# Patient Record
Sex: Male | Born: 1995 | Race: Black or African American | Hispanic: No | Marital: Single | Smoking: Never smoker
Health system: Southern US, Community
[De-identification: ages and names within clinical notes are randomized; demographics above are authoritative.]

---

## 2018-03-19 ENCOUNTER — Emergency Department (HOSPITAL_COMMUNITY)
Admission: EM | Admit: 2018-03-19 | Discharge: 2018-03-19 | Disposition: A | Discharge status: Home / Self Care | Payer: Commercial Managed Care - PPO | Attending: Emergency Medicine | Admitting: Emergency Medicine

## 2018-03-19 ENCOUNTER — Emergency Department (HOSPITAL_COMMUNITY): Payer: Commercial Managed Care - PPO

## 2018-03-19 ENCOUNTER — Other Ambulatory Visit: Payer: Self-pay

## 2018-03-19 ENCOUNTER — Other Ambulatory Visit (HOSPITAL_COMMUNITY): Payer: Commercial Managed Care - PPO

## 2018-03-19 ENCOUNTER — Encounter (HOSPITAL_COMMUNITY): Payer: Self-pay | Admitting: *Deleted

## 2018-03-19 DIAGNOSIS — Y9389 Activity, other specified: Secondary | ICD-10-CM | POA: Insufficient documentation

## 2018-03-19 DIAGNOSIS — S8992XA Unspecified injury of left lower leg, initial encounter: Secondary | ICD-10-CM | POA: Diagnosis present

## 2018-03-19 DIAGNOSIS — Y92410 Unspecified street and highway as the place of occurrence of the external cause: Secondary | ICD-10-CM | POA: Insufficient documentation

## 2018-03-19 DIAGNOSIS — Y999 Unspecified external cause status: Secondary | ICD-10-CM | POA: Diagnosis not present

## 2018-03-19 MED ORDER — CYCLOBENZAPRINE HCL 10 MG PO TABS: 10.0000 mg | ORAL_TABLET | Freq: Two times a day (BID) | ORAL | 0 refills | Status: DC | PRN

## 2018-03-19 MED ORDER — IBUPROFEN 200 MG PO TABS
600.0000 mg | ORAL_TABLET | Freq: Once | ORAL | Status: AC
  Administered 2018-03-19: 600 mg via ORAL
  Filled 2018-03-19: qty 3
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
## 2018-03-19 NOTE — ED Notes (Signed)
Pt did esignature, but it did not take on penpad. Pt verbalized understanding of d/c paperwork

## 2018-03-19 NOTE — Discharge Instructions (Addendum)
Please read instructions below. Apply ice to your areas of pain for 20 minutes at a time. You can take 600 mg of Advil/ibuprofen every 6 hours as needed for pain. You can take flexeril every 12 hours as needed for muscle spasm. Be aware this medication can make you drowsy. Schedule an appointment with the orthopedic specialist in 1 week for follow-up on your injury if symptoms persist. Return to the ER for severely worsening headache, vision changes, if new numbness or tingling in your arms or legs, inability to urinate, inability to hold your bowels, or weakness in your extremities.

## 2019-10-09 ENCOUNTER — Ambulatory Visit (HOSPITAL_COMMUNITY)
Admission: EM | Admit: 2019-10-09 | Discharge: 2019-10-09 | Disposition: A | Discharge status: Home / Self Care | Payer: Commercial Managed Care - PPO

## 2019-10-09 ENCOUNTER — Other Ambulatory Visit: Payer: Self-pay

## 2019-10-09 ENCOUNTER — Encounter (HOSPITAL_COMMUNITY): Payer: Self-pay

## 2019-10-09 DIAGNOSIS — R197 Diarrhea, unspecified: Secondary | ICD-10-CM | POA: Diagnosis not present

## 2019-10-09 NOTE — ED Provider Notes (Signed)
[facility]    CSN: [id number] Arrival date & time: 10/09/19  0831      History   Chief Complaint Chief Complaint  Patient presents with  . Diarrhea  . Abdominal Pain    HPI Brian Bailey is a 24 y.o. male.   Patient is an otherwise healthy 24 year old male who presents today with diarrhea x1 week.  Symptoms have been intermittent, waxing waning.  He gets generalized abdominal cramping and then episode of watery diarrhea.  The abdominal pain resolves after the diarrhea.  Denies any blood in stool.  Reporting 2 days ago the stool returned to normal and then he ate a high fatty meal last night in the diarrhea returned.  No current abdominal pain no fever, recent traveling, recent antibiotic use.  No alcohol or drug use.  No recent sick contacts, viral type symptoms.  ROS per HPI      History reviewed. No pertinent past medical history.  There are no problems to display for this patient.   History reviewed. No pertinent surgical history.     Home Medications    Prior to Admission medications   Not on File    Family History Family History  Family history unknown: Yes    Social History Social History   Tobacco Use  . Smoking status: Never Smoker  Substance Use Topics  . Alcohol use: Not on file  . Drug use: Not on file     Allergies   Patient has no known allergies.   Review of Systems Review of Systems   Physical Exam Triage Vital Signs ED Triage Vitals  Enc Vitals Group     BP 10/09/19 0914 122/72     Pulse Rate 10/09/19 0914 61     Resp 10/09/19 0914 17     Temp 10/09/19 0914 98.4 F (36.9 C)     Temp Source 10/09/19 0914 Oral     SpO2 10/09/19 0914 100 %     Weight --      Height --      Head Circumference --      Peak Flow --      Pain Score 10/09/19 0916 5     Pain Loc --      Pain Edu? --      Excl. in GC? --    No data found.  Updated Vital Signs BP 122/72 (BP Location: Right Arm)   Pulse 61   Temp 98.4 F  (36.9 C) (Oral)   Resp 17   SpO2 100%   Visual Acuity Right Eye Distance:   Left Eye Distance:   Bilateral Distance:    Right Eye Near:   Left Eye Near:    Bilateral Near:     Physical Exam Vitals and nursing note reviewed.  Constitutional:      Appearance: Normal appearance.  HENT:     Head: Normocephalic and atraumatic.     Nose: Nose normal.  Eyes:     Conjunctiva/sclera: Conjunctivae normal.  Pulmonary:     Effort: Pulmonary effort is normal.  Musculoskeletal:        General: Normal range of motion.     Cervical back: Normal range of motion.  Skin:    General: Skin is warm and dry.  Neurological:     Mental Status: He is alert.  Psychiatric:        Mood and Affect: Mood normal.      UC Treatments / Results  Labs (all labs ordered are  listed, but only abnormal results are displayed) Labs Reviewed - No data to display  EKG   Radiology No results found.  Procedures Procedures (including critical care time)  Medications Ordered in UC Medications - No data to display  Initial Impression / Assessment and Plan / UC Course  I have reviewed the triage vital signs and the nursing notes.  Pertinent labs & imaging results that were available during my care of the patient were reviewed by me and considered in my medical decision making (see chart for details).     [place] likely infectious or diet related. Recommend start probiotic Also recommended eating bland and clean for the next week to see if this improves symptoms. Gatorade for hydration and electrolyte replacement Recommended if this continues over the next week he will need to come back in for stool sample Follow up as needed for continued or worsening symptoms  Final Clinical Impressions(s) / UC Diagnoses   Final diagnoses:  Diarrhea of presumed infectious origin     Discharge Instructions     Believe this may be due to bacteria in the gut.  Try starting a probiotic. You can get  this over the counter.  Eat clean and no foods that upset the stomach.  Gatorades for rehydration.  Follow up as needed for continued or worsening symptoms Information about diet and diarrhea put in instructions.      ED Prescriptions    None     PDMP not reviewed this encounter.   Loura Halt A, NP 10/09/19 1020

## 2019-10-09 NOTE — Discharge Instructions (Signed)
Believe this may be due to bacteria in the gut.  Try starting a probiotic. You can get this over the counter.  Eat clean and no foods that upset the stomach.  Gatorades for rehydration.  Follow up as needed for continued or worsening symptoms Information about diet and diarrhea put in instructions.

## 2019-10-09 NOTE — ED Triage Notes (Signed)
Pt presents with generalized stomach cramping & diarrhea for a little over a week.

## 2020-03-25 ENCOUNTER — Other Ambulatory Visit: Payer: Self-pay

## 2020-03-25 ENCOUNTER — Ambulatory Visit (HOSPITAL_COMMUNITY)
Admission: EM | Admit: 2020-03-25 | Discharge: 2020-03-25 | Disposition: A | Discharge status: Home / Self Care | Payer: Commercial Managed Care - PPO | Attending: Family Medicine | Admitting: Family Medicine

## 2020-03-25 ENCOUNTER — Encounter (HOSPITAL_COMMUNITY): Payer: Self-pay

## 2020-03-25 DIAGNOSIS — Z20822 Contact with and (suspected) exposure to covid-19: Secondary | ICD-10-CM | POA: Diagnosis not present

## 2020-03-25 DIAGNOSIS — J029 Acute pharyngitis, unspecified: Secondary | ICD-10-CM

## 2020-03-25 MED ORDER — AMOXICILLIN 875 MG PO TABS: 875.0000 mg | ORAL_TABLET | Freq: Two times a day (BID) | ORAL | 0 refills | Status: AC

## 2020-03-25 NOTE — Discharge Instructions (Addendum)
You have been tested for COVID-19 today. °If your test returns positive, you will receive a phone call from [facility] regarding your results. °Negative test results are not called. °Both positive and negative results area always visible on MyChart. °If you do not have a MyChart account, sign up instructions are provided in your discharge papers. °Please do not hesitate to contact us should you have questions or concerns. ° °

## 2020-03-25 NOTE — ED Triage Notes (Signed)
Pt presents with sore throat X 3 days.  

## 2020-03-26 LAB — SARS CORONAVIRUS 2 (TAT 6-24 HRS): SARS Coronavirus 2: NEGATIVE

## 2020-03-26 NOTE — ED Provider Notes (Signed)
[facility]   [id number] 03/25/20 Arrival Time: 1853  ASSESSMENT & PLAN:  1. Pharyngitis, unspecified etiology     Will tx empirically for bacterial infection. COVID-19 testing sent. OTC symptom care as needed.  Begin: Meds ordered this encounter  Medications  . amoxicillin (AMOXIL) 875 MG tablet    Sig: Take 1 tablet (875 mg total) by mouth 2 (two) times daily for 10 days.    Dispense:  20 tablet    Refill:  0     Follow-up Information    [facility].   Specialty: Urgent Care Why: If worsening or failing to improve as anticipated. Contact information: [address] [place] [address] [phone]              Reviewed expectations re: course of current medical issues. Questions answered. Outlined signs and symptoms indicating need for more acute intervention. Understanding verbalized. After Visit Summary given.   SUBJECTIVE: History from: patient. Brian Bailey is a 24 y.o. male who presents with worries regarding COVID-19. Known COVID-19 contact: none. Recent travel: none. Reports: sore throat for 2-3 days; worsening. Denies: fever and cough. Normal PO intake without n/v/d.    OBJECTIVE:  Vitals:   03/25/20 2045 03/25/20 2047  BP:  121/60  Pulse: 65   Resp: 17   Temp: 99.2 F (37.3 C)   TempSrc: Oral   SpO2: 97%     General appearance: alert; no distress Eyes: PERRLA; EOMI; conjunctiva normal HENT: [place]; AT; without nasal congestion; significant throat erythema with exudative tonsils Neck: supple; small cervical LAD Lungs: speaks full sentences without difficulty; unlabored Extremities: no edema Skin: warm and dry Neurologic: normal gait Psychological: alert and cooperative; normal mood and affect  Labs:  Labs Reviewed  SARS CORONAVIRUS 2 (TAT 6-24 HRS)   No Known Allergies  History reviewed. No pertinent past medical history. Social History   Socioeconomic History  . Marital  status: Single    Spouse name: Not on file  . Number of children: Not on file  . Years of education: Not on file  . Highest education level: Not on file  Occupational History  . Not on file  Tobacco Use  . Smoking status: Never Smoker  Substance and Sexual Activity  . Alcohol use: Not on file  . Drug use: Not on file  . Sexual activity: Not on file  Other Topics Concern  . Not on file  Social History Narrative  . Not on file   Social Determinants of Health   Financial Resource Strain:   . Difficulty of Paying Living Expenses: Not on file  Food Insecurity:   . Worried About Programme researcher, broadcasting/film/video in the Last Year: Not on file  . Ran Out of Food in the Last Year: Not on file  Transportation Needs:   . Lack of Transportation (Medical): Not on file  . Lack of Transportation (Non-Medical): Not on file  Physical Activity:   . Days of Exercise per Week: Not on file  . Minutes of Exercise per Session: Not on file  Stress:   . Feeling of Stress : Not on file  Social Connections:   . Frequency of Communication with Friends and Family: Not on file  . Frequency of Social Gatherings with Friends and Family: Not on file  . Attends Religious Services: Not on file  . Active Member of Clubs or Organizations: Not on file  . Attends Banker Meetings: Not on file  .  Marital Status: Not on file  Intimate Partner Violence:   . Fear of Current or Ex-Partner: Not on file  . Emotionally Abused: Not on file  . Physically Abused: Not on file  . Sexually Abused: Not on file   Family History  Family history unknown: Yes   History reviewed. No pertinent surgical history.   Mardella Layman, MD 03/26/20 [phone]
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
## 2020-10-13 ENCOUNTER — Ambulatory Visit (HOSPITAL_COMMUNITY)
Admission: EM | Admit: 2020-10-13 | Discharge: 2020-10-13 | Disposition: A | Discharge status: Home / Self Care | Payer: Commercial Managed Care - PPO | Attending: Emergency Medicine | Admitting: Emergency Medicine

## 2020-10-13 ENCOUNTER — Other Ambulatory Visit: Payer: Self-pay

## 2020-10-13 ENCOUNTER — Encounter (HOSPITAL_COMMUNITY): Payer: Self-pay

## 2020-10-13 DIAGNOSIS — J069 Acute upper respiratory infection, unspecified: Secondary | ICD-10-CM

## 2020-10-13 MED ORDER — AZITHROMYCIN 250 MG PO TABS: ORAL_TABLET | ORAL | 0 refills | Status: AC

## 2020-10-13 MED ORDER — BENZONATATE 100 MG PO CAPS: 100.0000 mg | ORAL_CAPSULE | Freq: Three times a day (TID) | ORAL | 0 refills | Status: AC | PRN

## 2020-10-13 NOTE — ED Triage Notes (Signed)
Pt presents with c/o productive cough for over a week. Denies fever or other symptoms

## 2020-10-13 NOTE — Discharge Instructions (Signed)
Push fluids to ensure adequate hydration and keep secretions thin.  Over the counter medications as needed for your symptoms, I tend to recommend mucinex d for cough and congestion.  Complete course of antibiotics.  Tessalon as needed for cough.  If symptoms worsen or do not improve in the next week to return to be seen or to follow up with your PCP.

## 2020-10-13 NOTE — ED Provider Notes (Signed)
[facility]    CSN: [id number] Arrival date & time: 10/13/20  1644      History   Chief Complaint Chief Complaint  Patient presents with  . Cough    HPI Brian Bailey is a 25 y.o. male.   Brian Bailey presents with complaints of cough and congestion. Originally started around a week ago. Had started to feel better but then worsened again. Cough is productive of clear mucus. No fevers. No shortness of breath . Some ear popping. No sore throat. Some headache with cough only. Has been around others with similar cough/ uri symptoms. Negative covid pcr testing two days ago. No gi symptoms. No history of asthma. Doesn't smoke.    ROS per HPI, negative if not otherwise mentioned.      History reviewed. No pertinent past medical history.  There are no problems to display for this patient.   History reviewed. No pertinent surgical history.     Home Medications    Prior to Admission medications   Medication Sig Start Date End Date Taking? Authorizing Provider  azithromycin (ZITHROMAX) 250 MG tablet Take 2 tablets (500 mg total) by mouth daily for 1 day, THEN 1 tablet (250 mg total) daily for 4 days. 10/13/20 10/18/20 Yes Cowan Pilar, Barron Alvine, NP  benzonatate (TESSALON) 100 MG capsule Take 1-2 capsules (100-200 mg total) by mouth 3 (three) times daily as needed for cough. 10/13/20  Yes Georgetta Haber, NP    Family History Family History  Family history unknown: Yes    Social History Social History   Tobacco Use  . Smoking status: Never Smoker  . Smokeless tobacco: Never Used  Substance Use Topics  . Alcohol use: Not Currently  . Drug use: Not Currently     Allergies   Patient has no known allergies.   Review of Systems Review of Systems   Physical Exam Triage Vital Signs ED Triage Vitals  Enc Vitals Group     BP 10/13/20 1735 (!) 114/59     Pulse Rate 10/13/20 1735 62     Resp 10/13/20 1735 18     Temp 10/13/20 1735 98.2 F (36.8 C)      Temp src --      SpO2 10/13/20 1735 97 %     Weight --      Height --      Head Circumference --      Peak Flow --      Pain Score 10/13/20 1734 0     Pain Loc --      Pain Edu? --      Excl. in GC? --    No data found.  Updated Vital Signs BP (!) 114/59   Pulse 62   Temp 98.2 F (36.8 C)   Resp 18   SpO2 97%   Visual Acuity Right Eye Distance:   Left Eye Distance:   Bilateral Distance:    Right Eye Near:   Left Eye Near:    Bilateral Near:     Physical Exam Constitutional:      Appearance: He is well-developed.  HENT:     Right Ear: Tympanic membrane normal.     Left Ear: Tympanic membrane normal.     Nose: Congestion present.  Cardiovascular:     Rate and Rhythm: Normal rate.  Pulmonary:     Effort: Pulmonary effort is normal.     Breath sounds: Normal breath sounds.     Comments: Strong dry cough noted  Skin:    General: Skin is warm and dry.  Neurological:     Mental Status: He is alert and oriented to person, place, and time.      UC Treatments / Results  Labs (all labs ordered are listed, but only abnormal results are displayed) Labs Reviewed - No data to display  EKG   Radiology No results found.  Procedures Procedures (including critical care time)  Medications Ordered in UC Medications - No data to display  Initial Impression / Assessment and Plan / UC Course  I have reviewed the triage vital signs and the nursing notes.  Pertinent labs & imaging results that were available during my care of the patient were reviewed by me and considered in my medical decision making (see chart for details).     Vitals stable. Non toxic in appearance and no work of breathing. Cough worsening after over a week of symptoms. Negative covid. Still may be viral? With worsening of symptoms opted to cover empirically with azithromycin. Tessalon prn recommended. Return precautions provided. Patient verbalized understanding and agreeable to plan.   Final  Clinical Impressions(s) / UC Diagnoses   Final diagnoses:  Acute upper respiratory infection     Discharge Instructions     Push fluids to ensure adequate hydration and keep secretions thin.  Over the counter medications as needed for your symptoms, I tend to recommend mucinex d for cough and congestion.  Complete course of antibiotics.  Tessalon as needed for cough.  If symptoms worsen or do not improve in the next week to return to be seen or to follow up with your PCP.     ED Prescriptions    Medication Sig Dispense Auth. Provider   azithromycin (ZITHROMAX) 250 MG tablet Take 2 tablets (500 mg total) by mouth daily for 1 day, THEN 1 tablet (250 mg total) daily for 4 days. 6 tablet Linus Mako B, NP   benzonatate (TESSALON) 100 MG capsule Take 1-2 capsules (100-200 mg total) by mouth 3 (three) times daily as needed for cough. 21 capsule Georgetta Haber, NP     PDMP not reviewed this encounter.   Georgetta Haber, NP 10/13/20 1836
